# Patient Record
Sex: Female | Born: 2000 | Race: White | Hispanic: No | Marital: Married | Smoking: Never smoker
Health system: Southern US, Community
[De-identification: ages and names within clinical notes are randomized; demographics above are authoritative.]

## PROBLEM LIST (undated history)

## (undated) DIAGNOSIS — J45909 Unspecified asthma, uncomplicated: Secondary | ICD-10-CM

## (undated) DIAGNOSIS — F329 Major depressive disorder, single episode, unspecified: Secondary | ICD-10-CM

## (undated) DIAGNOSIS — F32A Depression, unspecified: Secondary | ICD-10-CM

## (undated) HISTORY — DX: Depression, unspecified: F32.A

---
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
## 2019-04-26 IMAGING — DX DG MANDIBLE 4+V
4 series · 4 of 4 positions shown · non-contrast
Comparison: None in PACs

CLINICAL DATA: Bilateral jaw pain after injury while cheerleading 2
days ago. The injury resulted in a chipped tooth at that time.

EXAM:
MANDIBLE - 4+ VIEW

[mandible pa]
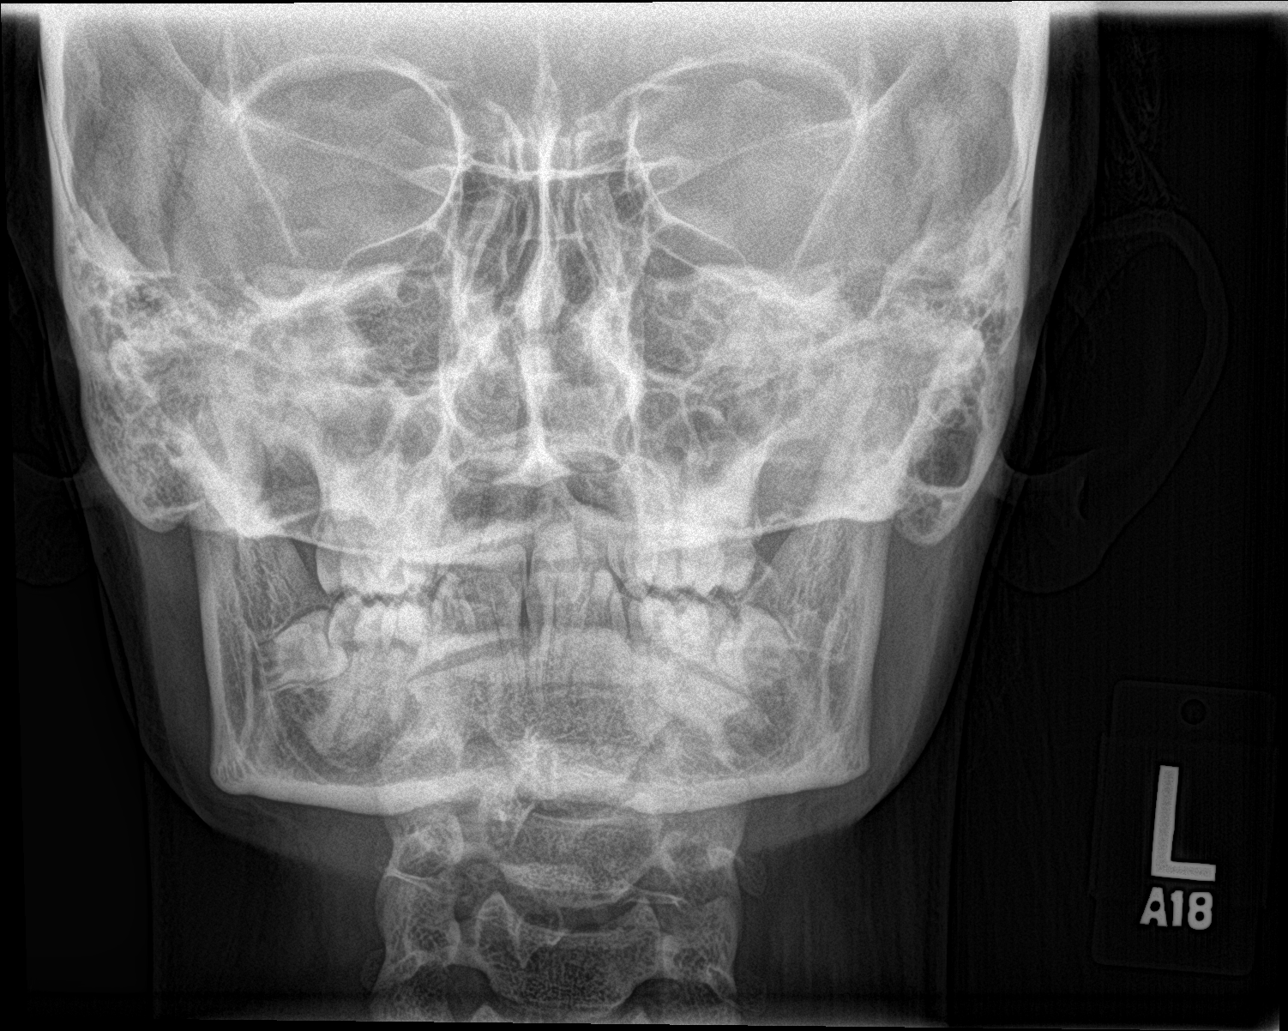

[mandible lat (1 of 2)]
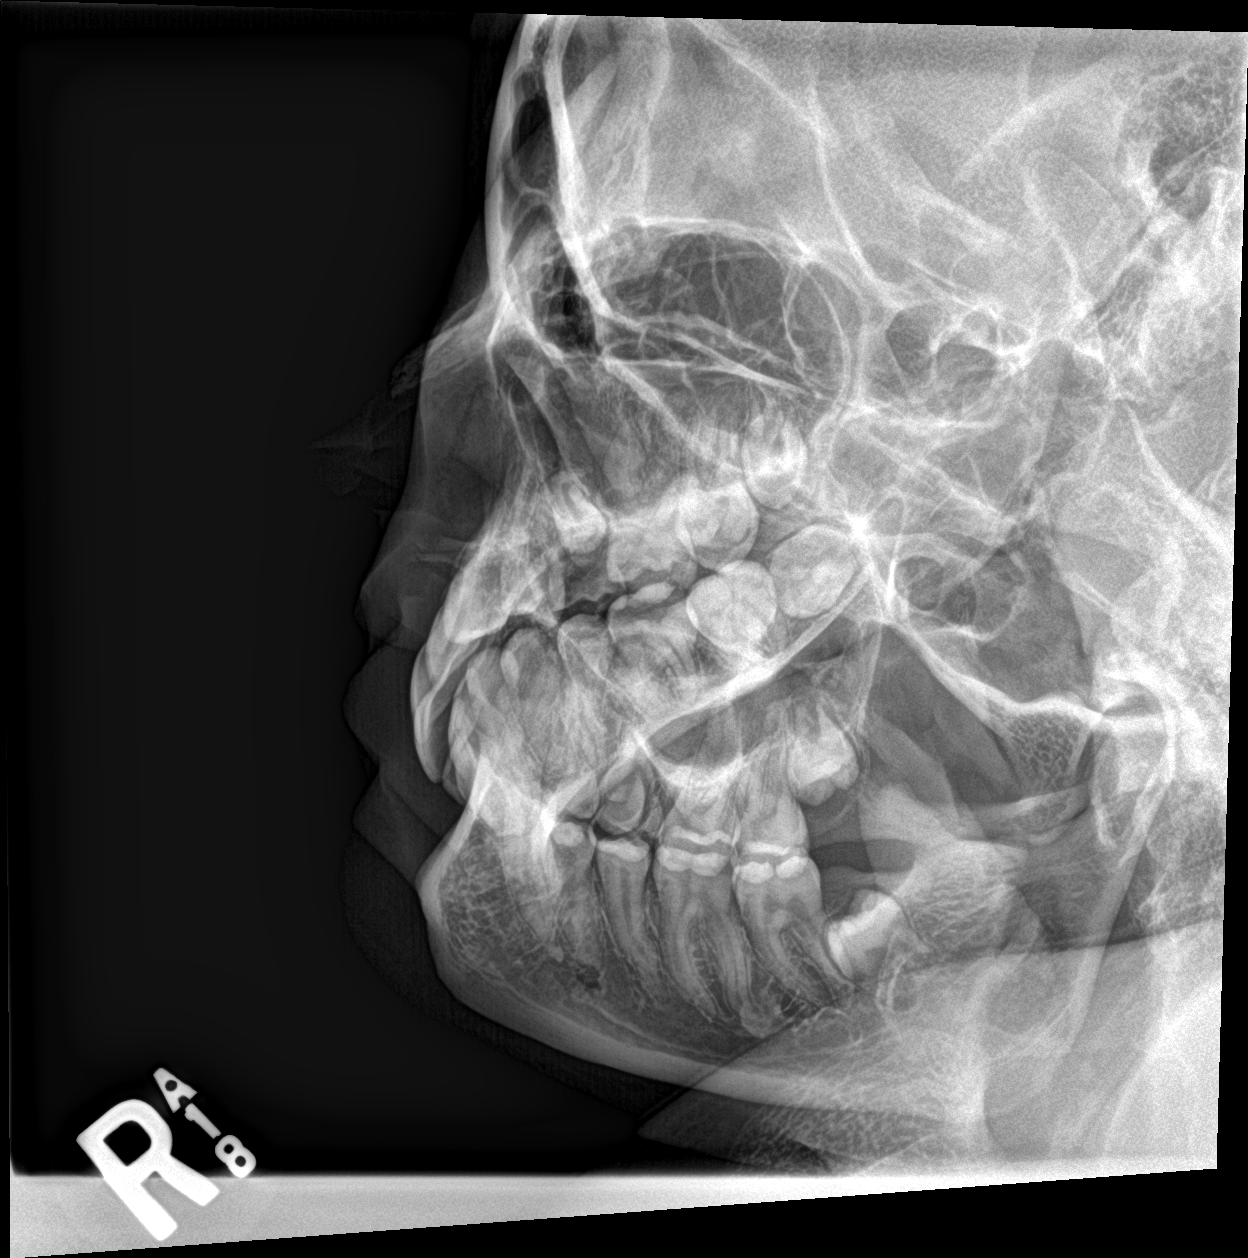

[mandible lat (2 of 2)]
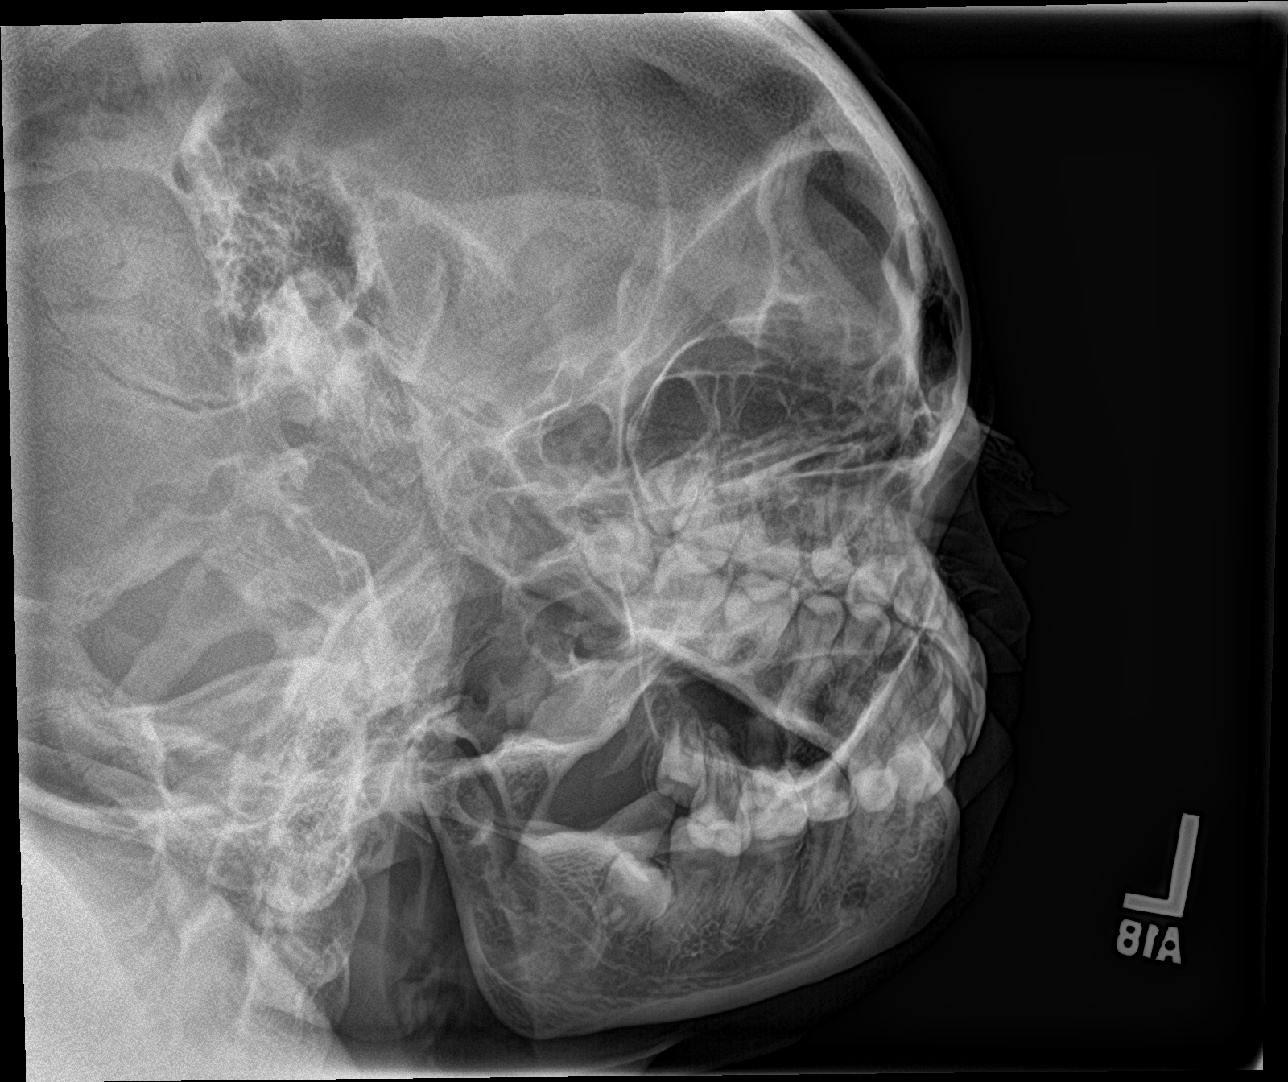

[skull townes]
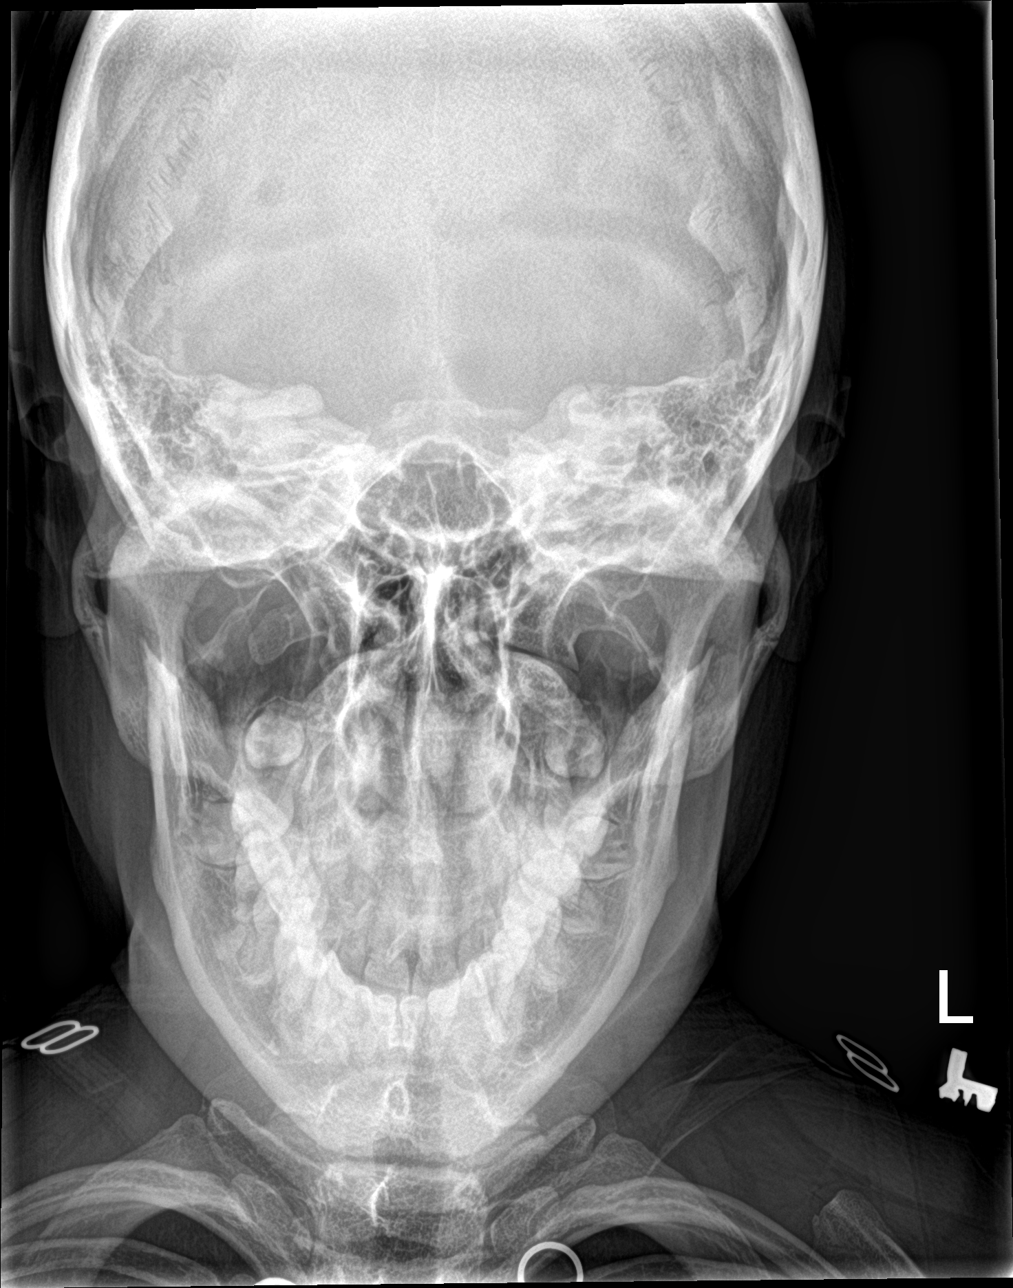

[4 of 4 positions shown; findings below may reference images not displayed]

FINDINGS: The mandible is subjectively intact. No acute fracture is observed.
As best as can be determined the condyles are intact. No acute
dental abnormalities are observed. There are lower wisdom teeth
bilaterally which are unerupted and lie horizontally.
IMPRESSION: No acute mandibular fracture is observed.

## 2019-05-08 DIAGNOSIS — J302 Other seasonal allergic rhinitis: Secondary | ICD-10-CM | POA: Diagnosis not present

## 2019-05-08 DIAGNOSIS — J4530 Mild persistent asthma, uncomplicated: Secondary | ICD-10-CM | POA: Diagnosis not present

## 2019-06-27 ENCOUNTER — Other Ambulatory Visit: Payer: Self-pay

## 2019-06-27 DIAGNOSIS — Z20822 Contact with and (suspected) exposure to covid-19: Secondary | ICD-10-CM

## 2019-06-28 LAB — NOVEL CORONAVIRUS, NAA: SARS-CoV-2, NAA: NOT DETECTED

## 2019-07-03 DIAGNOSIS — F411 Generalized anxiety disorder: Secondary | ICD-10-CM | POA: Diagnosis not present

## 2019-07-03 DIAGNOSIS — Z0001 Encounter for general adult medical examination with abnormal findings: Secondary | ICD-10-CM | POA: Diagnosis not present

## 2019-07-03 DIAGNOSIS — Z0000 Encounter for general adult medical examination without abnormal findings: Secondary | ICD-10-CM | POA: Diagnosis not present

## 2019-07-03 DIAGNOSIS — Z1389 Encounter for screening for other disorder: Secondary | ICD-10-CM | POA: Diagnosis not present

## 2019-07-03 DIAGNOSIS — Z68.41 Body mass index (BMI) pediatric, less than 5th percentile for age: Secondary | ICD-10-CM | POA: Diagnosis not present

## 2019-07-04 DIAGNOSIS — Z118 Encounter for screening for other infectious and parasitic diseases: Secondary | ICD-10-CM | POA: Diagnosis not present

## 2019-07-04 DIAGNOSIS — Z0000 Encounter for general adult medical examination without abnormal findings: Secondary | ICD-10-CM | POA: Diagnosis not present

## 2019-07-04 DIAGNOSIS — Z0001 Encounter for general adult medical examination with abnormal findings: Secondary | ICD-10-CM | POA: Diagnosis not present

## 2019-07-04 DIAGNOSIS — Z1389 Encounter for screening for other disorder: Secondary | ICD-10-CM | POA: Diagnosis not present

## 2019-08-09 ENCOUNTER — Ambulatory Visit: Payer: BC Managed Care – PPO | Attending: Internal Medicine

## 2019-08-09 ENCOUNTER — Other Ambulatory Visit: Payer: Self-pay

## 2019-08-09 DIAGNOSIS — Z20822 Contact with and (suspected) exposure to covid-19: Secondary | ICD-10-CM | POA: Insufficient documentation

## 2019-08-10 LAB — NOVEL CORONAVIRUS, NAA: SARS-CoV-2, NAA: NOT DETECTED

## 2019-09-19 ENCOUNTER — Encounter: Payer: Self-pay | Admitting: Advanced Practice Midwife

## 2019-09-19 ENCOUNTER — Other Ambulatory Visit: Payer: Self-pay

## 2019-09-19 ENCOUNTER — Ambulatory Visit (INDEPENDENT_AMBULATORY_CARE_PROVIDER_SITE_OTHER): Payer: BC Managed Care – PPO | Admitting: Advanced Practice Midwife

## 2019-09-19 VITALS — BP 119/73 | HR 80 | Ht 63.0 in | Wt 152.0 lb

## 2019-09-19 DIAGNOSIS — Z30011 Encounter for initial prescription of contraceptive pills: Secondary | ICD-10-CM

## 2019-09-19 DIAGNOSIS — N921 Excessive and frequent menstruation with irregular cycle: Secondary | ICD-10-CM | POA: Diagnosis not present

## 2019-09-19 MED ORDER — ORTHO-NOVUM 1/35 (28) 1-35 MG-MCG PO TABS: 1.0000 | ORAL_TABLET | Freq: Every day | ORAL | 3 refills | Status: DC

## 2019-09-19 NOTE — Patient Instructions (Signed)

## 2019-09-19 NOTE — Progress Notes (Signed)
   GYN VISIT Patient name: Carla Burton MRN [id number]  Date of birth: 21-Aug-2000 Chief Complaint:   Contraception (irregular periods)  History of Present Illness:   Carla Burton is a 19 y.o. G0P0000 Caucasian female being seen today for management of oral contraception for cycle control. Has been on Ortho Novum 7/7/7 x 35yrs and has been having extended times of BTB and would like to change. Is not sexually active.     Patient's last menstrual period was 09/03/2019 (exact date). The current method of family planning is abstinence.  Last pap <21yo.  Review of Systems:   Pertinent items are noted in HPI Denies fever/chills, dizziness, headaches, visual disturbances, fatigue, shortness of breath, chest pain, abdominal pain, vomiting, abnormal vaginal discharge/itching/odor/irritation, problems with periods, bowel movements, urination, or intercourse unless otherwise stated above.  Pertinent History Reviewed:  Reviewed past medical,surgical, social, obstetrical and family history.  Reviewed problem list, medications and allergies. Physical Assessment:   Vitals:   09/19/19 1141  BP: 119/73  Pulse: 80  Weight: 152 lb (68.9 kg)  Height: 5\' 3"  (1.6 m)  Body mass index is 26.93 kg/m.       Physical Examination:   General appearance: alert, well appearing, and in no distress  Mental status: alert, oriented to person, place, and time  Skin: warm & dry   Cardiovascular: normal heart rate noted  Respiratory: normal respiratory effort, no distress  Abdomen: soft, non-tender   Pelvic: normal external genitalia, vulva, vagina, cervix, uterus and adnexa, examination not indicated  Extremities: no edema   Chaperone: n/a    No results found for this or any previous visit (from the past 24 hour(s)).  Assessment & Plan:  1) BTB with oral contraception> change to monophasic pill   Meds:  Meds ordered this encounter  Medications  . norethindrone-ethinyl estradiol 1/35 (ORTHO-NOVUM  1/35, 28,) tablet    Sig: Take 1 tablet by mouth daily.    Dispense:  1 Package    Refill:  3    Order Specific Question:   Supervising Provider    Answer:   2/35 [[id number]]    No orders of the defined types were placed in this encounter.   Return in about 3 months (around 12/20/2019) for contraception f/u.  02/19/2020 CNM 09/19/2019 11:59 AM

## 2019-12-19 ENCOUNTER — Ambulatory Visit: Payer: BC Managed Care – PPO | Admitting: Adult Health

## 2019-12-24 ENCOUNTER — Ambulatory Visit: Payer: BC Managed Care – PPO | Admitting: Adult Health

## 2019-12-25 ENCOUNTER — Telehealth: Payer: Self-pay | Admitting: Adult Health

## 2019-12-25 NOTE — Telephone Encounter (Signed)
Pt called because she missed f/u appt regarding contraception yesterday states the bc is working just fine and does not feel she really needs to r/s the appointment if anything changes she will call back at later time to r/s apt

## 2020-02-06 ENCOUNTER — Other Ambulatory Visit: Payer: Self-pay | Admitting: Advanced Practice Midwife

## 2020-03-10 ENCOUNTER — Other Ambulatory Visit: Payer: Self-pay | Admitting: Advanced Practice Midwife

## 2020-04-09 DIAGNOSIS — Z118 Encounter for screening for other infectious and parasitic diseases: Secondary | ICD-10-CM | POA: Diagnosis not present

## 2020-04-09 DIAGNOSIS — Z68.41 Body mass index (BMI) pediatric, less than 5th percentile for age: Secondary | ICD-10-CM | POA: Diagnosis not present

## 2020-04-09 DIAGNOSIS — Z0000 Encounter for general adult medical examination without abnormal findings: Secondary | ICD-10-CM | POA: Diagnosis not present

## 2020-04-09 DIAGNOSIS — Z1389 Encounter for screening for other disorder: Secondary | ICD-10-CM | POA: Diagnosis not present

## 2020-04-09 DIAGNOSIS — Z7189 Other specified counseling: Secondary | ICD-10-CM | POA: Diagnosis not present

## 2021-01-27 DIAGNOSIS — Z111 Encounter for screening for respiratory tuberculosis: Secondary | ICD-10-CM | POA: Diagnosis not present

## 2021-01-27 DIAGNOSIS — Z0000 Encounter for general adult medical examination without abnormal findings: Secondary | ICD-10-CM | POA: Diagnosis not present

## 2021-01-27 DIAGNOSIS — Z6828 Body mass index (BMI) 28.0-28.9, adult: Secondary | ICD-10-CM | POA: Diagnosis not present

## 2021-01-27 DIAGNOSIS — E663 Overweight: Secondary | ICD-10-CM | POA: Diagnosis not present

## 2021-02-11 DIAGNOSIS — Z23 Encounter for immunization: Secondary | ICD-10-CM | POA: Diagnosis not present

## 2021-02-11 DIAGNOSIS — Z0000 Encounter for general adult medical examination without abnormal findings: Secondary | ICD-10-CM | POA: Diagnosis not present

## 2021-02-12 DIAGNOSIS — Z23 Encounter for immunization: Secondary | ICD-10-CM | POA: Diagnosis not present

## 2021-03-19 DIAGNOSIS — Z23 Encounter for immunization: Secondary | ICD-10-CM | POA: Diagnosis not present

## 2021-03-26 DIAGNOSIS — J4530 Mild persistent asthma, uncomplicated: Secondary | ICD-10-CM | POA: Diagnosis not present

## 2021-03-26 DIAGNOSIS — U071 COVID-19: Secondary | ICD-10-CM | POA: Diagnosis not present

## 2021-04-23 DIAGNOSIS — Z23 Encounter for immunization: Secondary | ICD-10-CM | POA: Diagnosis not present

## 2021-08-07 DIAGNOSIS — E6609 Other obesity due to excess calories: Secondary | ICD-10-CM | POA: Diagnosis not present

## 2021-08-07 DIAGNOSIS — Z6829 Body mass index (BMI) 29.0-29.9, adult: Secondary | ICD-10-CM | POA: Diagnosis not present

## 2021-08-07 DIAGNOSIS — G43909 Migraine, unspecified, not intractable, without status migrainosus: Secondary | ICD-10-CM | POA: Diagnosis not present

## 2021-08-07 DIAGNOSIS — R519 Headache, unspecified: Secondary | ICD-10-CM | POA: Diagnosis not present

## 2021-12-07 DIAGNOSIS — Z6829 Body mass index (BMI) 29.0-29.9, adult: Secondary | ICD-10-CM | POA: Diagnosis not present

## 2021-12-07 DIAGNOSIS — J029 Acute pharyngitis, unspecified: Secondary | ICD-10-CM | POA: Diagnosis not present

## 2021-12-07 DIAGNOSIS — H5711 Ocular pain, right eye: Secondary | ICD-10-CM | POA: Diagnosis not present

## 2021-12-17 DIAGNOSIS — H5213 Myopia, bilateral: Secondary | ICD-10-CM | POA: Diagnosis not present

## 2021-12-17 DIAGNOSIS — H5713 Ocular pain, bilateral: Secondary | ICD-10-CM | POA: Diagnosis not present

## 2022-02-01 DIAGNOSIS — Z0001 Encounter for general adult medical examination with abnormal findings: Secondary | ICD-10-CM | POA: Diagnosis not present

## 2022-02-01 DIAGNOSIS — Z111 Encounter for screening for respiratory tuberculosis: Secondary | ICD-10-CM | POA: Diagnosis not present

## 2022-02-16 DIAGNOSIS — E663 Overweight: Secondary | ICD-10-CM | POA: Diagnosis not present

## 2022-02-16 DIAGNOSIS — Z0001 Encounter for general adult medical examination with abnormal findings: Secondary | ICD-10-CM | POA: Diagnosis not present

## 2022-02-16 DIAGNOSIS — Z6829 Body mass index (BMI) 29.0-29.9, adult: Secondary | ICD-10-CM | POA: Diagnosis not present

## 2022-02-16 DIAGNOSIS — F419 Anxiety disorder, unspecified: Secondary | ICD-10-CM | POA: Diagnosis not present

## 2022-02-16 DIAGNOSIS — E785 Hyperlipidemia, unspecified: Secondary | ICD-10-CM | POA: Diagnosis not present

## 2022-02-16 DIAGNOSIS — Z1331 Encounter for screening for depression: Secondary | ICD-10-CM | POA: Diagnosis not present

## 2023-02-04 DIAGNOSIS — Z0001 Encounter for general adult medical examination with abnormal findings: Secondary | ICD-10-CM | POA: Diagnosis not present

## 2023-02-04 DIAGNOSIS — Z111 Encounter for screening for respiratory tuberculosis: Secondary | ICD-10-CM | POA: Diagnosis not present

## 2023-02-04 DIAGNOSIS — E785 Hyperlipidemia, unspecified: Secondary | ICD-10-CM | POA: Diagnosis not present

## 2023-02-24 DIAGNOSIS — K5900 Constipation, unspecified: Secondary | ICD-10-CM | POA: Diagnosis not present

## 2023-02-24 DIAGNOSIS — R1030 Lower abdominal pain, unspecified: Secondary | ICD-10-CM | POA: Diagnosis not present

## 2023-02-24 DIAGNOSIS — Z6832 Body mass index (BMI) 32.0-32.9, adult: Secondary | ICD-10-CM | POA: Diagnosis not present

## 2023-02-24 DIAGNOSIS — N342 Other urethritis: Secondary | ICD-10-CM | POA: Diagnosis not present

## 2023-02-24 DIAGNOSIS — E6609 Other obesity due to excess calories: Secondary | ICD-10-CM | POA: Diagnosis not present

## 2023-02-24 DIAGNOSIS — R109 Unspecified abdominal pain: Secondary | ICD-10-CM | POA: Diagnosis not present

## 2023-03-17 DIAGNOSIS — Z6832 Body mass index (BMI) 32.0-32.9, adult: Secondary | ICD-10-CM | POA: Diagnosis not present

## 2023-03-17 DIAGNOSIS — E785 Hyperlipidemia, unspecified: Secondary | ICD-10-CM | POA: Diagnosis not present

## 2023-03-17 DIAGNOSIS — F419 Anxiety disorder, unspecified: Secondary | ICD-10-CM | POA: Diagnosis not present

## 2023-03-17 DIAGNOSIS — Z1331 Encounter for screening for depression: Secondary | ICD-10-CM | POA: Diagnosis not present

## 2023-03-17 DIAGNOSIS — E6609 Other obesity due to excess calories: Secondary | ICD-10-CM | POA: Diagnosis not present

## 2023-03-17 DIAGNOSIS — Z0001 Encounter for general adult medical examination with abnormal findings: Secondary | ICD-10-CM | POA: Diagnosis not present

## 2023-10-27 DIAGNOSIS — J301 Allergic rhinitis due to pollen: Secondary | ICD-10-CM | POA: Diagnosis not present

## 2023-10-27 DIAGNOSIS — J0100 Acute maxillary sinusitis, unspecified: Secondary | ICD-10-CM | POA: Diagnosis not present

## 2024-04-16 ENCOUNTER — Other Ambulatory Visit (HOSPITAL_COMMUNITY)
Admission: RE | Admit: 2024-04-16 | Discharge: 2024-04-16 | Disposition: A | Discharge status: Home / Self Care | Source: Ambulatory Visit | Attending: Obstetrics & Gynecology | Admitting: Obstetrics & Gynecology

## 2024-04-16 ENCOUNTER — Encounter: Payer: Self-pay | Admitting: Adult Health

## 2024-04-16 ENCOUNTER — Ambulatory Visit (INDEPENDENT_AMBULATORY_CARE_PROVIDER_SITE_OTHER): Admitting: Adult Health

## 2024-04-16 VITALS — BP 122/84 | HR 82 | Ht 64.0 in | Wt 218.0 lb

## 2024-04-16 DIAGNOSIS — Z01419 Encounter for gynecological examination (general) (routine) without abnormal findings: Secondary | ICD-10-CM | POA: Insufficient documentation

## 2024-04-16 DIAGNOSIS — Z1151 Encounter for screening for human papillomavirus (HPV): Secondary | ICD-10-CM

## 2024-04-16 DIAGNOSIS — Z1331 Encounter for screening for depression: Secondary | ICD-10-CM

## 2024-04-16 NOTE — Progress Notes (Signed)
 Patient ID: Carla Burton, female   DOB: [birth year], 23 y.o.   MRN: [id number] History of Present Illness: Carla Burton is a 23 year old white female, married, G0P0, in for a well woman gyn exam and pap. She has been on Opill, but is wanting to stop. 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  She is a new pt. PCP is Carla Mace PA   Current Medications, Allergies, Past Medical History, Past Surgical History, Family History and Social History were reviewed in [employer] record.     Review of Systems: Patient denies any headaches, hearing loss, fatigue, blurred vision, shortness of breath, chest pain, abdominal pain, problems with bowel movements, urination, or intercourse. No joint pain or mood swings.     Physical Exam:BP 122/84 (BP Location: Right Arm, Patient Position: Sitting, Cuff Size: Large)   Pulse 82   Ht 5' 4 (1.626 m)   Wt 218 lb (98.9 kg)   LMP 04/09/2024   BMI 37.42 kg/m   General:  Well developed, well nourished, no acute distress Skin:  Warm and dry Neck:  Midline trachea, normal thyroid , good ROM, no lymphadenopathy Lungs; Clear to auscultation bilaterally Breast:  No dominant palpable mass, retraction, or nipple discharge Cardiovascular: Regular rate and rhythm Abdomen:  Soft, non tender, no hepatosplenomegaly Pelvic:  External genitalia is normal in appearance, no lesions.  The vagina is normal in appearance,+blood. Urethra has no lesions or masses. The cervix is smooth, pap with GC/CHL performed.  Uterus is felt to be normal size, shape, and contour.  No adnexal masses or tenderness noted.Bladder is non tender, no masses felt. Extremities/musculoskeletal:  No swelling or varicosities noted, no clubbing or cyanosis Psych:  No mood changes, alert and cooperative,seems happy AA is 1 Fall risk is low    04/16/2024    1:51 PM  Depression screen PHQ 2/9  Decreased Interest 0  Down, Depressed,  Hopeless 0  PHQ - 2 Score 0  Altered sleeping 0  Tired, decreased energy 0  Change in appetite 0  Feeling bad or failure about yourself  0  Trouble concentrating 0  Moving slowly or fidgety/restless 0  Suicidal thoughts 0  PHQ-9 Score 0       04/16/2024    1:51 PM  GAD 7 : Generalized Anxiety Score  Nervous, Anxious, on Edge 0  Control/stop worrying 0  Worry too much - different things 0  Trouble relaxing 0  Restless 0  Easily annoyed or irritable 0  Afraid - awful might happen 0  Total GAD 7 Score 0      Upstream - 04/16/24 1347       Pregnancy Intention Screening   Does the patient want to become pregnant in the next year? Ok Either Way    Does the patient's partner want to become pregnant in the next year? Ok Either Way    Would the patient like to discuss contraceptive options today? No      Contraception Wrap Up   Current Method Oral Contraceptive    End Method Female Condom    Contraception Counseling Provided Yes    How was the end contraceptive method provided? N/A         Examination chaperoned by Clarita Salt LPN   Impression and plan: 1. Encounter for gynecological examination with Papanicolaou smear of cervix (Primary) Pap sent Pap in 3 years if negative Physical in 1 year Can stop Opill now and  take OTC PNV Use condoms, don't get pregnant while taking Wegovy, has been on 2 weeks  - Cytology - PAP( Christine)

## 2024-04-18 ENCOUNTER — Ambulatory Visit: Payer: Self-pay | Admitting: Adult Health

## 2024-04-18 LAB — CYTOLOGY - PAP
Chlamydia: NEGATIVE
Comment: NEGATIVE
Comment: NORMAL
Diagnosis: NEGATIVE
Neisseria Gonorrhea: NEGATIVE
